# Patient Record
Sex: Female | Born: 1945 | Race: Black or African American | Hispanic: No | Smoking: Former smoker
Health system: Southern US, Community
[De-identification: ages and names within clinical notes are randomized; demographics above are authoritative.]

## PROBLEM LIST (undated history)

## (undated) DIAGNOSIS — E559 Vitamin D deficiency, unspecified: Secondary | ICD-10-CM

## (undated) DIAGNOSIS — R569 Unspecified convulsions: Secondary | ICD-10-CM

## (undated) DIAGNOSIS — F200 Paranoid schizophrenia: Secondary | ICD-10-CM

## (undated) HISTORY — DX: Vitamin D deficiency, unspecified: E55.9

## (undated) HISTORY — DX: Unspecified convulsions: R56.9

## (undated) HISTORY — DX: Paranoid schizophrenia: F20.0

## (undated) HISTORY — PX: HIP SURGERY: SHX245

---

## 2015-08-26 ENCOUNTER — Other Ambulatory Visit (HOSPITAL_COMMUNITY): Payer: Self-pay | Admitting: Internal Medicine

## 2015-08-26 DIAGNOSIS — Z1231 Encounter for screening mammogram for malignant neoplasm of breast: Secondary | ICD-10-CM

## 2015-09-04 ENCOUNTER — Inpatient Hospital Stay (HOSPITAL_COMMUNITY): Admission: RE | Admit: 2015-09-04 | Payer: Self-pay | Source: Ambulatory Visit

## 2015-10-17 ENCOUNTER — Ambulatory Visit (HOSPITAL_COMMUNITY): Payer: Self-pay

## 2015-11-04 ENCOUNTER — Ambulatory Visit (HOSPITAL_COMMUNITY): Payer: Self-pay

## 2015-11-06 ENCOUNTER — Ambulatory Visit (HOSPITAL_COMMUNITY)
Admission: RE | Admit: 2015-11-06 | Discharge: 2015-11-06 | Disposition: A | Discharge status: Home / Self Care | Payer: Medicaid Other | Source: Ambulatory Visit | Attending: Internal Medicine | Admitting: Internal Medicine

## 2015-11-06 DIAGNOSIS — Z1231 Encounter for screening mammogram for malignant neoplasm of breast: Secondary | ICD-10-CM | POA: Insufficient documentation

## 2016-08-31 ENCOUNTER — Telehealth: Payer: Self-pay

## 2016-08-31 NOTE — Telephone Encounter (Signed)
Pt received a triage letter from [facility]. Please call [phone]

## 2016-09-09 NOTE — Telephone Encounter (Signed)
I called and spoke to pt's sister, Cleon Gustin, who helps the pt. Pt does not understand, sister is not POA, but helps her out. Pt has been scheduled an OV with Lewie Loron, NP on 09/29/2016 at 1:30 pm.

## 2016-09-29 ENCOUNTER — Encounter: Payer: Self-pay | Admitting: Gastroenterology

## 2016-09-29 ENCOUNTER — Telehealth: Payer: Self-pay | Admitting: Gastroenterology

## 2016-09-29 ENCOUNTER — Ambulatory Visit: Payer: Medicaid Other | Admitting: Gastroenterology

## 2016-09-29 NOTE — Telephone Encounter (Signed)
PATIENT WAS A NO SHOW AND LETTER SENT  °

## 2016-10-01 ENCOUNTER — Other Ambulatory Visit: Payer: Self-pay

## 2016-10-01 ENCOUNTER — Encounter: Payer: Self-pay | Admitting: Nurse Practitioner

## 2016-10-01 ENCOUNTER — Ambulatory Visit (INDEPENDENT_AMBULATORY_CARE_PROVIDER_SITE_OTHER): Payer: Medicaid Other | Admitting: Nurse Practitioner

## 2016-10-01 DIAGNOSIS — Z1211 Encounter for screening for malignant neoplasm of colon: Secondary | ICD-10-CM

## 2016-10-01 DIAGNOSIS — R69 Illness, unspecified: Secondary | ICD-10-CM | POA: Insufficient documentation

## 2016-10-01 MED ORDER — PEG 3350-KCL-NA BICARB-NACL 420 G PO SOLR: 4000.0000 mL | ORAL | 0 refills | Status: AC

## 2016-10-01 NOTE — Assessment & Plan Note (Signed)
The patient is due for first-ever colonoscopy. She was referred by primary care. She is minimally communicative and accompanied by her sister today who is responsible for helping the patient with the majority of her care and medical care. She is essentially asymptomatic from a GI standpoint. She is on multiple antipsychotic medications. We will proceed with colonoscopy at this time.  Proceed with colonoscopy on propofol/MAC with Dr. Oneida Alar in the near future. The risks, benefits, and alternatives have been discussed in detail with the patient. They state understanding and desire to proceed.   The patient is on antipsychotics and has a history of paranoid schizophrenia. No other anticoagulants, anxiolytics, chronic pain medications, or antidepressants. We will and for the procedure on propofol/MAC to promote adequate sedation.  The patient's Sr. Nevada Crane sign her consent for the procedure.

## 2016-10-01 NOTE — Patient Instructions (Signed)
1. We will schedule your colonoscopy for you. 2. Further recommendations will be made after your procedure. 3. Return for follow-up based on recommendations made after your colonoscopy. 4. Call with any questions or concerns.

## 2016-10-01 NOTE — Progress Notes (Signed)
cc'ed to pcp °
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
## 2016-10-16 NOTE — Patient Instructions (Signed)
Bonnie Norton  10/16/2016     @PREFPERIOPPHARMACY @   Your procedure is scheduled on   10/27/2016   Report to [facility] at  1230  P.M.  Call this number if you have problems the morning of surgery:  [phone]   Remember:  Do not eat food or drink liquids after midnight.  Take these medicines the morning of surgery with A SIP OF WATER  depakote   Do not wear jewelry, make-up or nail polish.  Do not wear lotions, powders, or perfumes, or deoderant.  Do not shave 48 hours prior to surgery.  Men may shave face and neck.  Do not bring valuables to the hospital.  [facility] is not responsible for any belongings or valuables.  Contacts, dentures or bridgework may not be worn into surgery.  Leave your suitcase in the car.  After surgery it may be brought to your room.  For patients admitted to the hospital, discharge time will be determined by your treatment team.  Patients discharged the day of surgery will not be allowed to drive home.   Name and phone number of your driver:   family Special instructions:  Follow the diet and prep inatructions given to you by Dr Evelina Dun office.  Please read over the following fact sheets that you were given. Anesthesia Post-op Instructions and Care and Recovery After Surgery       Colonoscopy, Adult A colonoscopy is an exam to look at the entire large intestine. During the exam, a lubricated, bendable tube is inserted into the anus and then passed into the rectum, colon, and other parts of the large intestine. A colonoscopy is often done as a part of normal colorectal screening or in response to certain symptoms, such as anemia, persistent diarrhea, abdominal pain, and blood in the stool. The exam can help screen for and diagnose medical problems, including:  Tumors.  Polyps.  Inflammation.  Areas of bleeding. Tell a health care provider about:  Any allergies you have.  All medicines you are taking, including vitamins,  herbs, eye drops, creams, and over-the-counter medicines.  Any problems you or family members have had with anesthetic medicines.  Any blood disorders you have.  Any surgeries you have had.  Any medical conditions you have.  Any problems you have had passing stool. What are the risks? Generally, this is a safe procedure. However, problems may occur, including:  Bleeding.  A tear in the intestine.  A reaction to medicines given during the exam.  Infection (rare). What happens before the procedure? Eating and drinking restrictions  Follow instructions from your health care provider about eating and drinking, which may include:  A few days before the procedure - follow a low-fiber diet. Avoid nuts, seeds, dried fruit, raw fruits, and vegetables.  1-3 days before the procedure - follow a clear liquid diet. Drink only clear liquids, such as clear broth or bouillon, black coffee or tea, clear juice, clear soft drinks or sports drinks, gelatin dessert, and popsicles. Avoid any liquids that contain red or purple dye.  On the day of the procedure - do not eat or drink anything during the 2 hours before the procedure, or within the time period that your health care provider recommends. Bowel prep  If you were prescribed an oral bowel prep to clean out your colon:  Take it as told by your health care provider. Starting the day before your procedure, you will need  to drink a large amount of medicated liquid. The liquid will cause you to have multiple loose stools until your stool is almost clear or light green.  If your skin or anus gets irritated from diarrhea, you may use these to relieve the irritation:  Medicated wipes, such as adult wet wipes with aloe and vitamin E.  A skin soothing-product like petroleum jelly.  If you vomit while drinking the bowel prep, take a break for up to 60 minutes and then begin the bowel prep again. If vomiting continues and you cannot take the bowel prep  without vomiting, call your health care provider. General instructions   Ask your health care provider about changing or stopping your regular medicines. This is especially important if you are taking diabetes medicines or blood thinners.  Plan to have someone take you home from the hospital or clinic. What happens during the procedure?  An IV tube may be inserted into one of your veins.  You will be given medicine to help you relax (sedative).  To reduce your risk of infection:  Your health care team will wash or sanitize their hands.  Your anal area will be washed with soap.  You will be asked to lie on your side with your knees bent.  Your health care provider will lubricate a long, thin, flexible tube. The tube will have a camera and a light on the end.  The tube will be inserted into your anus.  The tube will be gently eased through your rectum and colon.  Air will be delivered into your colon to keep it open. You may feel some pressure or cramping.  The camera will be used to take images during the procedure.  A small tissue sample may be removed from your body to be examined under a microscope (biopsy). If any potential problems are found, the tissue will be sent to a lab for testing.  If small polyps are found, your health care provider may remove them and have them checked for cancer cells.  The tube that was inserted into your anus will be slowly removed. The procedure may vary among health care providers and hospitals. What happens after the procedure?  Your blood pressure, heart rate, breathing rate, and blood oxygen level will be monitored until the medicines you were given have worn off.  Do not drive for 24 hours after the exam.  You may have a small amount of blood in your stool.  You may pass gas and have mild abdominal cramping or bloating due to the air that was used to inflate your colon during the exam.  It is up to you to get the results of your  procedure. Ask your health care provider, or the department performing the procedure, when your results will be ready. This information is not intended to replace advice given to you by your health care provider. Make sure you discuss any questions you have with your health care provider. Document Released: 05/08/2000 Document Revised: 03/11/2016 Document Reviewed: 07/23/2015 Elsevier Interactive Patient Education  2017 [employer]  Colonoscopy, Adult, Care After This sheet gives you information about how to care for yourself after your procedure. Your health care provider may also give you more specific instructions. If you have problems or questions, contact your health care provider. What can I expect after the procedure? After the procedure, it is common to have:  A small amount of blood in your stool for 24 hours after the procedure.  Some gas.  Mild abdominal cramping or bloating. Follow these instructions at home: General instructions    For the first 24 hours after the procedure:  Do not drive or use machinery.  Do not sign important documents.  Do not drink alcohol.  Do your regular daily activities at a slower pace than normal.  Eat soft, easy-to-digest foods.  Rest often.  Take over-the-counter or prescription medicines only as told by your health care provider.  It is up to you to get the results of your procedure. Ask your health care provider, or the department performing the procedure, when your results will be ready. Relieving cramping and bloating   Try walking around when you have cramps or feel bloated.  Apply heat to your abdomen as told by your health care provider. Use a heat source that your health care provider recommends, such as a moist heat pack or a heating pad.  Place a towel between your skin and the heat source.  Leave the heat on for 20-30 minutes.  Remove the heat if your skin turns bright red. This is especially important if you are  unable to feel pain, heat, or cold. You may have a greater risk of getting burned. Eating and drinking   Drink enough fluid to keep your urine clear or pale yellow.  Resume your normal diet as instructed by your health care provider. Avoid heavy or fried foods that are hard to digest.  Avoid drinking alcohol for as long as instructed by your health care provider. Contact a health care provider if:  You have blood in your stool 2-3 days after the procedure. Get help right away if:  You have more than a small spotting of blood in your stool.  You pass large blood clots in your stool.  Your abdomen is swollen.  You have nausea or vomiting.  You have a fever.  You have increasing abdominal pain that is not relieved with medicine. This information is not intended to replace advice given to you by your health care provider. Make sure you discuss any questions you have with your health care provider. Document Released: 12/24/2003 Document Revised: 02/03/2016 Document Reviewed: 07/23/2015 Elsevier Interactive Patient Education  2017 Else[employer] Inc.  Monitored Anesthesia Care Anesthesia is a term that refers to techniques, procedures, and medicines that help a person stay safe and comfortable during a medical procedure. Monitored anesthesia care, or sedation, is one type of anesthesia. Your anesthesia specialist may recommend sedation if you will be having a procedure that does not require you to be unconscious, such as:  Cataract surgery.  A dental procedure.  A biopsy.  A colonoscopy. During the procedure, you may receive a medicine to help you relax (sedative). There are three levels of sedation:  Mild sedation. At this level, you may feel awake and relaxed. You will be able to follow directions.  Moderate sedation. At this level, you will be sleepy. You may not remember the procedure.  Deep sedation. At this level, you will be asleep. You will not remember the procedure. The  more medicine you are given, the deeper your level of sedation will be. Depending on how you respond to the procedure, the anesthesia specialist may change your level of sedation or the type of anesthesia to fit your needs. An anesthesia specialist will monitor you closely during the procedure. Let your health care provider know about:  Any allergies you have.  All medicines you are taking, including vitamins, herbs, eye drops, creams, and over-the-counter medicines.  Any use of steroids (by mouth or as a cream).  Any problems you or family members have had with sedatives and anesthetic medicines.  Any blood disorders you have.  Any surgeries you have had.  Any medical conditions you have, such as sleep apnea.  Whether you are pregnant or may be pregnant.  Any use of cigarettes, alcohol, or street drugs. What are the risks? Generally, this is a safe procedure. However, problems may occur, including:  Getting too much medicine (oversedation).  Nausea.  Allergic reaction to medicines.  Trouble breathing. If this happens, a breathing tube may be used to help with breathing. It will be removed when you are awake and breathing on your own.  Heart trouble.  Lung trouble. Before the procedure Staying hydrated  Follow instructions from your health care provider about hydration, which may include:  Up to 2 hours before the procedure - you may continue to drink clear liquids, such as water, clear fruit juice, black coffee, and plain tea. Eating and drinking restrictions  Follow instructions from your health care provider about eating and drinking, which may include:  8 hours before the procedure - stop eating heavy meals or foods such as meat, fried foods, or fatty foods.  6 hours before the procedure - stop eating light meals or foods, such as toast or cereal.  6 hours before the procedure - stop drinking milk or drinks that contain milk.  2 hours before the procedure - stop  drinking clear liquids. Medicines  Ask your health care provider about:  Changing or stopping your regular medicines. This is especially important if you are taking diabetes medicines or blood thinners.  Taking medicines such as aspirin and ibuprofen. These medicines can thin your blood. Do not take these medicines before your procedure if your health care provider instructs you not to. Tests and exams  You will have a physical exam.  You may have blood tests done to show:  How well your kidneys and liver are working.  How well your blood can clot.  General instructions  Plan to have someone take you home from the hospital or clinic.  If you will be going home right after the procedure, plan to have someone with you for 24 hours. What happens during the procedure?  Your blood pressure, heart rate, breathing, level of pain and overall condition will be monitored.  An IV tube will be inserted into one of your veins.  Your anesthesia specialist will give you medicines as needed to keep you comfortable during the procedure. This may mean changing the level of sedation.  The procedure will be performed. After the procedure  Your blood pressure, heart rate, breathing rate, and blood oxygen level will be monitored until the medicines you were given have worn off.  Do not drive for 24 hours if you received a sedative.  You may:  Feel sleepy, clumsy, or nauseous.  Feel forgetful about what happened after the procedure.  Have a sore throat if you had a breathing tube during the procedure.  Vomit. This information is not intended to replace advice given to you by your health care provider. Make sure you discuss any questions you have with your health care provider. Document Released: 02/04/2005 Document Revised: 10/18/2015 Document Reviewed: 09/01/2015 Elsevier Interactive Patient Education  2017 [employer] Monitored Anesthesia Care, Care After These instructions provide you  with information about caring for yourself after your procedure. Your health care provider may also give you more specific instructions.  Your treatment has been planned according to current medical practices, but problems sometimes occur. Call your health care provider if you have any problems or questions after your procedure. What can I expect after the procedure? After your procedure, it is common to:  Feel sleepy for several hours.  Feel clumsy and have poor balance for several hours.  Feel forgetful about what happened after the procedure.  Have poor judgment for several hours.  Feel nauseous or vomit.  Have a sore throat if you had a breathing tube during the procedure. Follow these instructions at home: For at least 24 hours after the procedure:    Do not:  Participate in activities in which you could fall or become injured.  Drive.  Use heavy machinery.  Drink alcohol.  Take sleeping pills or medicines that cause drowsiness.  Make important decisions or sign legal documents.  Take care of children on your own.  Rest. Eating and drinking   Follow the diet that is recommended by your health care provider.  If you vomit, drink water, juice, or soup when you can drink without vomiting.  Make sure you have little or no nausea before eating solid foods. General instructions   Have a responsible adult stay with you until you are awake and alert.  Take over-the-counter and prescription medicines only as told by your health care provider.  If you smoke, do not smoke without supervision.  Keep all follow-up visits as told by your health care provider. This is important. Contact a health care provider if:  You keep feeling nauseous or you keep vomiting.  You feel light-headed.  You develop a rash.  You have a fever. Get help right away if:  You have trouble breathing. This information is not intended to replace advice given to you by your health care  provider. Make sure you discuss any questions you have with your health care provider. Document Released: 09/01/2015 Document Revised: 01/01/2016 Document Reviewed: 09/01/2015 Elsevier Interactive Patient Education  2017 [employer].

## 2016-10-21 ENCOUNTER — Encounter (HOSPITAL_COMMUNITY): Payer: Self-pay

## 2016-10-21 ENCOUNTER — Encounter (HOSPITAL_COMMUNITY)
Admission: RE | Admit: 2016-10-21 | Discharge: 2016-10-21 | Disposition: A | Discharge status: Home / Self Care | Payer: Medicaid Other | Source: Ambulatory Visit | Attending: Gastroenterology | Admitting: Gastroenterology

## 2016-10-21 NOTE — Pre-Procedure Instructions (Signed)
Patient did not show for PAT. Called patient x2 and no answer. There was no answering machine to leave message. Called Ginger at Dr Evelina DunField's office to make her aware.

## 2016-10-26 ENCOUNTER — Telehealth: Payer: Self-pay

## 2016-10-26 NOTE — Telephone Encounter (Signed)
Pt is set up for office visit on 12/03/16 @ 1:30

## 2016-10-26 NOTE — Telephone Encounter (Signed)
THE patient MAY CONTACT US TO RESCHEDULE. HOWEVER IF SHE CALLS TO [facility] AFTER 30 DAYS SHE WILL NEED AN OPV PRIOR TO TCS.

## 2016-10-26 NOTE — Telephone Encounter (Signed)
Pt's sister called ( Mel Clark) to cancel her TCS. I told her that she might have to come back into the office to get rescheduled. Please advise  If she needs to come back into the office.

## 2016-10-27 ENCOUNTER — Encounter (HOSPITAL_COMMUNITY): Admission: RE | Payer: Self-pay | Source: Ambulatory Visit

## 2016-10-27 ENCOUNTER — Ambulatory Visit (HOSPITAL_COMMUNITY): Admission: RE | Admit: 2016-10-27 | Payer: Medicaid Other | Source: Ambulatory Visit | Admitting: Gastroenterology

## 2016-10-27 SURGERY — COLONOSCOPY WITH PROPOFOL
Anesthesia: Monitor Anesthesia Care

## 2016-11-19 ENCOUNTER — Other Ambulatory Visit (HOSPITAL_COMMUNITY): Payer: Self-pay | Admitting: Internal Medicine

## 2016-11-19 DIAGNOSIS — Z1231 Encounter for screening mammogram for malignant neoplasm of breast: Secondary | ICD-10-CM

## 2016-11-23 ENCOUNTER — Ambulatory Visit (HOSPITAL_COMMUNITY): Payer: Medicaid Other

## 2016-11-26 ENCOUNTER — Ambulatory Visit (HOSPITAL_COMMUNITY)
Admission: RE | Admit: 2016-11-26 | Discharge: 2016-11-26 | Disposition: A | Discharge status: Home / Self Care | Payer: Medicaid Other | Source: Ambulatory Visit | Attending: Internal Medicine | Admitting: Internal Medicine

## 2016-11-26 DIAGNOSIS — Z1231 Encounter for screening mammogram for malignant neoplasm of breast: Secondary | ICD-10-CM | POA: Diagnosis present

## 2016-12-03 ENCOUNTER — Ambulatory Visit: Payer: Medicaid Other | Admitting: Gastroenterology

## 2017-01-08 NOTE — Progress Notes (Signed)
REVIEWED. NO SHOW JUL 2018.

## 2018-02-17 ENCOUNTER — Other Ambulatory Visit (HOSPITAL_COMMUNITY): Payer: Self-pay | Admitting: Internal Medicine

## 2018-02-17 DIAGNOSIS — Z1231 Encounter for screening mammogram for malignant neoplasm of breast: Secondary | ICD-10-CM

## 2018-02-24 ENCOUNTER — Ambulatory Visit (HOSPITAL_COMMUNITY)
Admission: RE | Admit: 2018-02-24 | Discharge: 2018-02-24 | Disposition: A | Discharge status: Home / Self Care | Payer: Medicaid Other | Source: Ambulatory Visit | Attending: Internal Medicine | Admitting: Internal Medicine

## 2018-02-24 ENCOUNTER — Encounter (HOSPITAL_COMMUNITY): Payer: Self-pay

## 2018-02-24 DIAGNOSIS — Z1231 Encounter for screening mammogram for malignant neoplasm of breast: Secondary | ICD-10-CM | POA: Diagnosis present

## 2019-05-09 ENCOUNTER — Other Ambulatory Visit (HOSPITAL_COMMUNITY): Payer: Self-pay | Admitting: Internal Medicine

## 2019-05-09 DIAGNOSIS — Z1231 Encounter for screening mammogram for malignant neoplasm of breast: Secondary | ICD-10-CM

## 2019-06-01 ENCOUNTER — Ambulatory Visit (HOSPITAL_COMMUNITY): Payer: Medicaid Other

## 2019-06-15 ENCOUNTER — Other Ambulatory Visit: Payer: Self-pay

## 2019-06-15 ENCOUNTER — Ambulatory Visit (HOSPITAL_COMMUNITY)
Admission: RE | Admit: 2019-06-15 | Discharge: 2019-06-15 | Disposition: A | Discharge status: Home / Self Care | Payer: Medicaid Other | Source: Ambulatory Visit | Attending: Internal Medicine | Admitting: Internal Medicine

## 2019-06-15 DIAGNOSIS — Z1231 Encounter for screening mammogram for malignant neoplasm of breast: Secondary | ICD-10-CM | POA: Diagnosis not present

## 2020-04-01 ENCOUNTER — Ambulatory Visit (HOSPITAL_COMMUNITY)
Admission: RE | Admit: 2020-04-01 | Discharge: 2020-04-01 | Disposition: A | Discharge status: Home / Self Care | Payer: Medicaid Other | Source: Ambulatory Visit | Attending: Gerontology | Admitting: Gerontology

## 2020-04-01 ENCOUNTER — Other Ambulatory Visit: Payer: Self-pay

## 2020-04-01 ENCOUNTER — Other Ambulatory Visit (HOSPITAL_COMMUNITY): Payer: Self-pay

## 2020-04-01 ENCOUNTER — Other Ambulatory Visit (HOSPITAL_COMMUNITY): Payer: Self-pay | Admitting: Gerontology

## 2020-04-01 DIAGNOSIS — M25551 Pain in right hip: Secondary | ICD-10-CM | POA: Diagnosis present

## 2020-07-04 ENCOUNTER — Other Ambulatory Visit (HOSPITAL_COMMUNITY): Payer: Self-pay | Admitting: Internal Medicine

## 2020-07-04 DIAGNOSIS — Z1231 Encounter for screening mammogram for malignant neoplasm of breast: Secondary | ICD-10-CM

## 2020-08-01 ENCOUNTER — Ambulatory Visit (HOSPITAL_COMMUNITY)
Admission: RE | Admit: 2020-08-01 | Discharge: 2020-08-01 | Disposition: A | Discharge status: Home / Self Care | Payer: Medicaid Other | Source: Ambulatory Visit | Attending: Internal Medicine | Admitting: Internal Medicine

## 2020-08-01 ENCOUNTER — Other Ambulatory Visit: Payer: Self-pay

## 2020-08-01 DIAGNOSIS — Z1231 Encounter for screening mammogram for malignant neoplasm of breast: Secondary | ICD-10-CM | POA: Insufficient documentation

## 2021-09-08 ENCOUNTER — Other Ambulatory Visit (HOSPITAL_COMMUNITY): Payer: Self-pay | Admitting: Hospitalist

## 2021-09-08 DIAGNOSIS — Z1231 Encounter for screening mammogram for malignant neoplasm of breast: Secondary | ICD-10-CM

## 2021-10-02 ENCOUNTER — Ambulatory Visit (HOSPITAL_COMMUNITY)
Admission: RE | Admit: 2021-10-02 | Discharge: 2021-10-02 | Disposition: A | Discharge status: Home / Self Care | Payer: Medicaid Other | Source: Ambulatory Visit | Attending: Hospitalist | Admitting: Hospitalist

## 2021-10-02 DIAGNOSIS — Z1231 Encounter for screening mammogram for malignant neoplasm of breast: Secondary | ICD-10-CM | POA: Diagnosis present

## 2022-12-18 IMAGING — MG MM DIGITAL SCREENING BILAT W/ TOMO AND CAD
8 series · 8 of 24 positions shown · non-contrast
Comparison: Previous exam(s).

CLINICAL DATA: Screening.

EXAM:
DIGITAL SCREENING BILATERAL MAMMOGRAM WITH TOMOSYNTHESIS AND CAD
TECHNIQUE: Bilateral screening digital craniocaudal and mediolateral oblique
mammograms were obtained. Bilateral screening digital breast
tomosynthesis was performed. The images were evaluated with
computer-aided detection.

[R CC synth-2D]
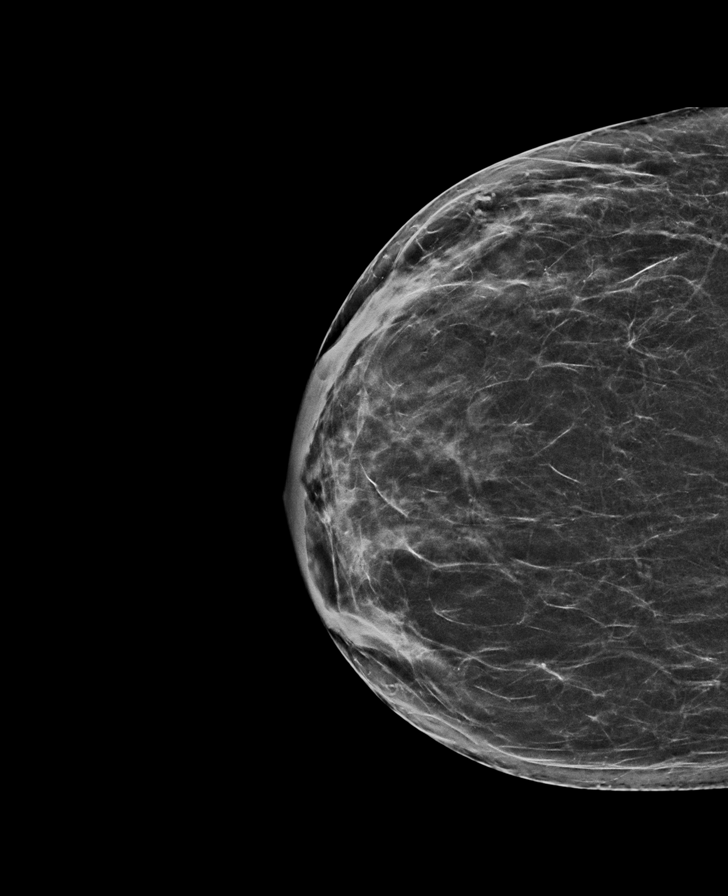

[R MLO synth-2D]
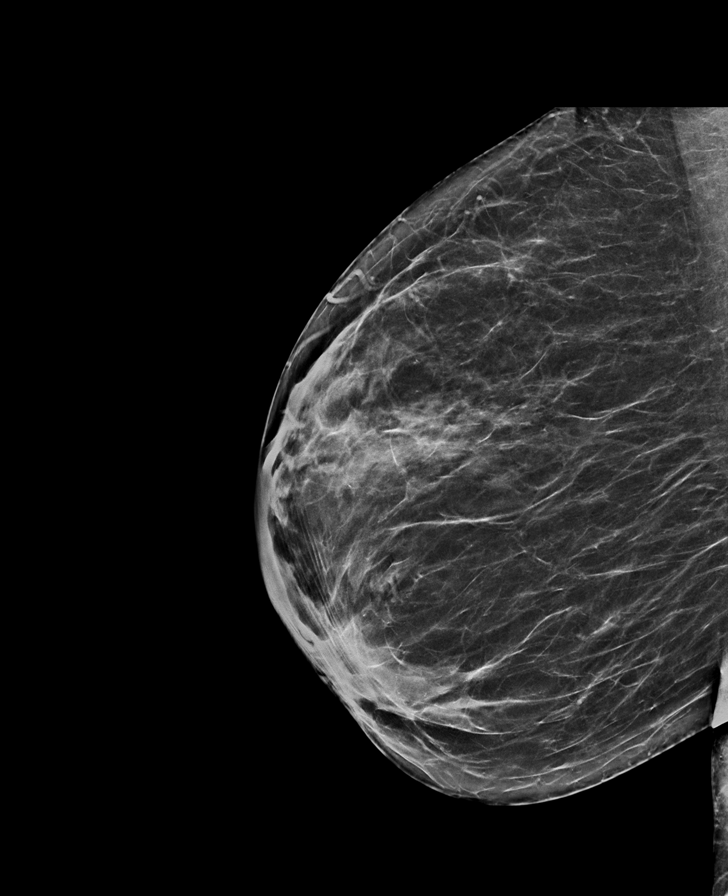

[L CC synth-2D]
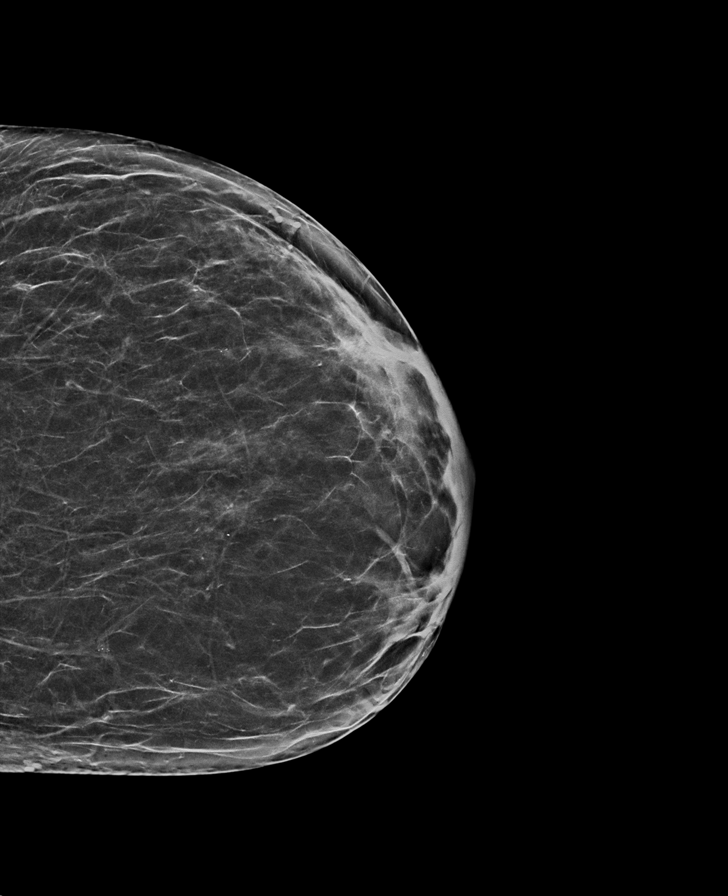

[L MLO synth-2D]
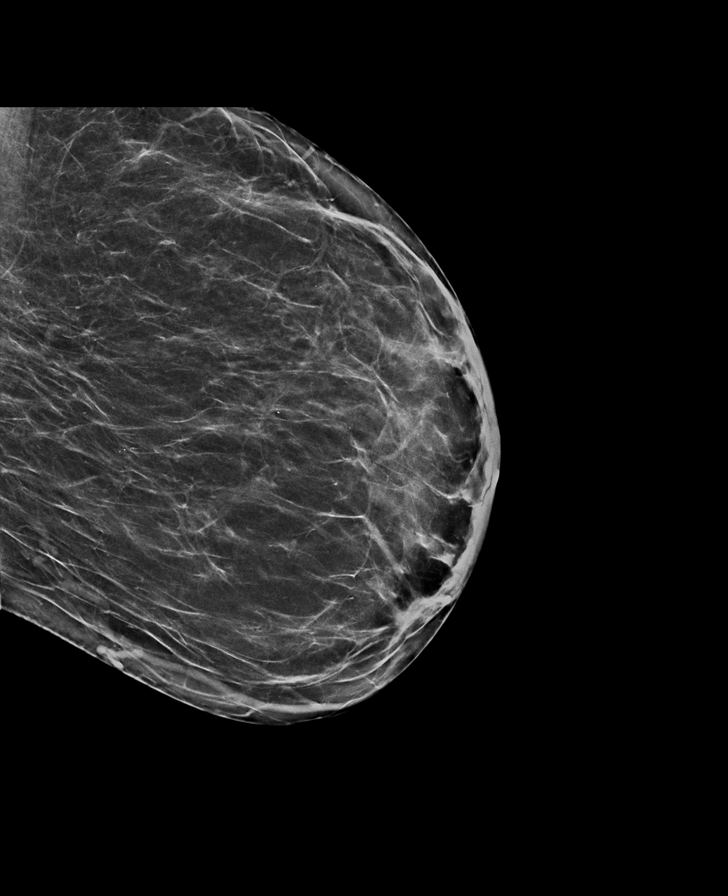

[L MLO tomo · tomo slice 35/69.0]
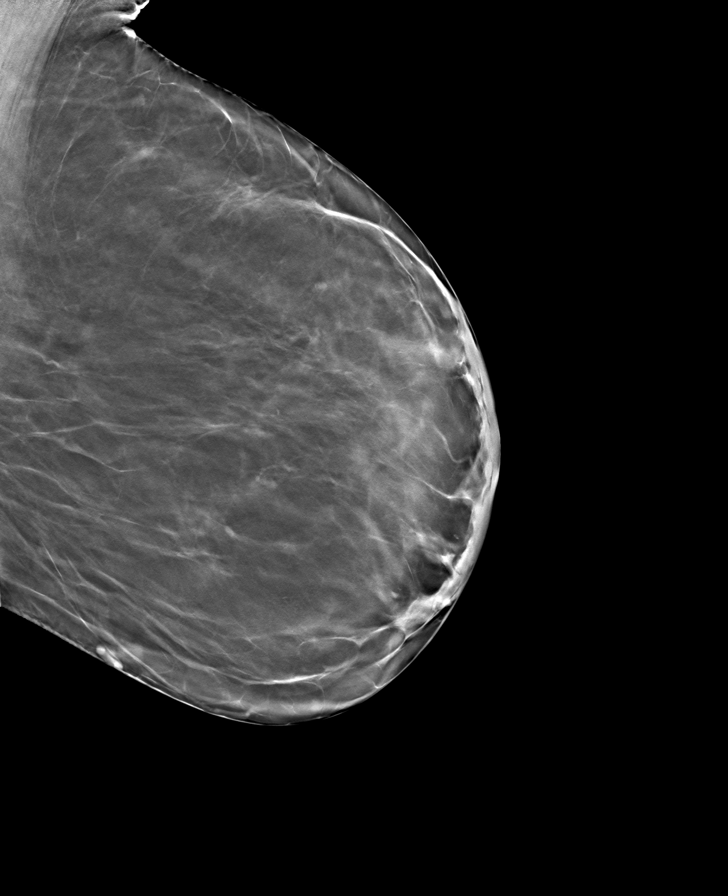

[R MLO tomo · tomo slice 35/70.0]
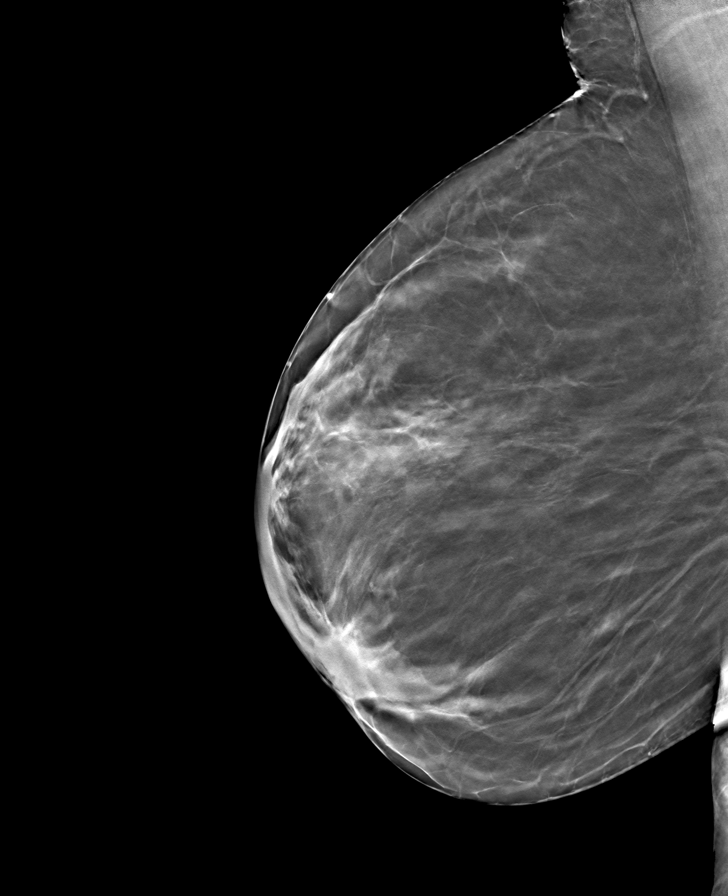

[R CC tomo · tomo slice 33/66.0]
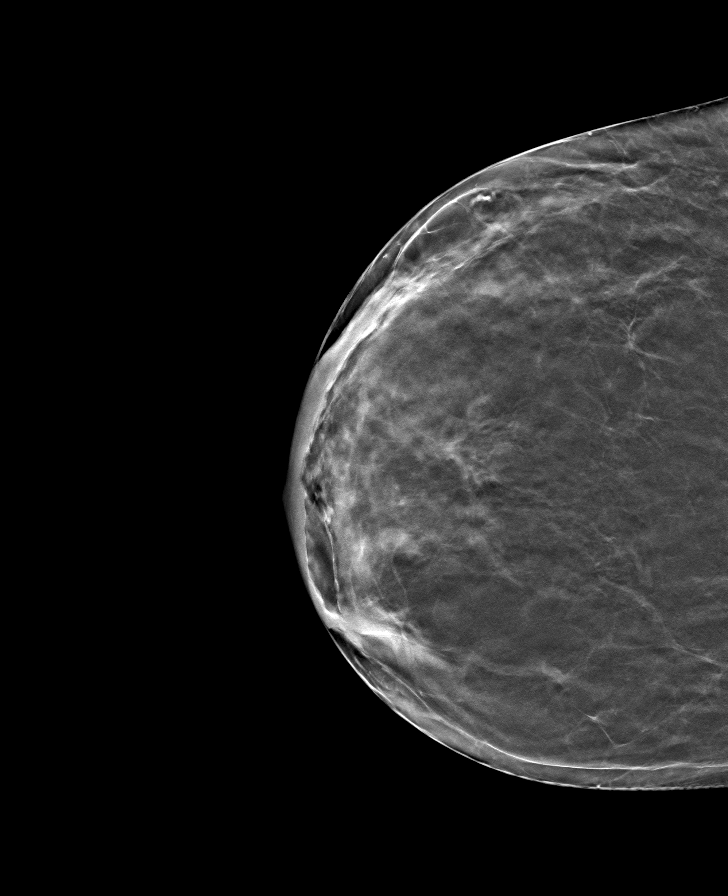

[L CC tomo · tomo slice 33/64.0]
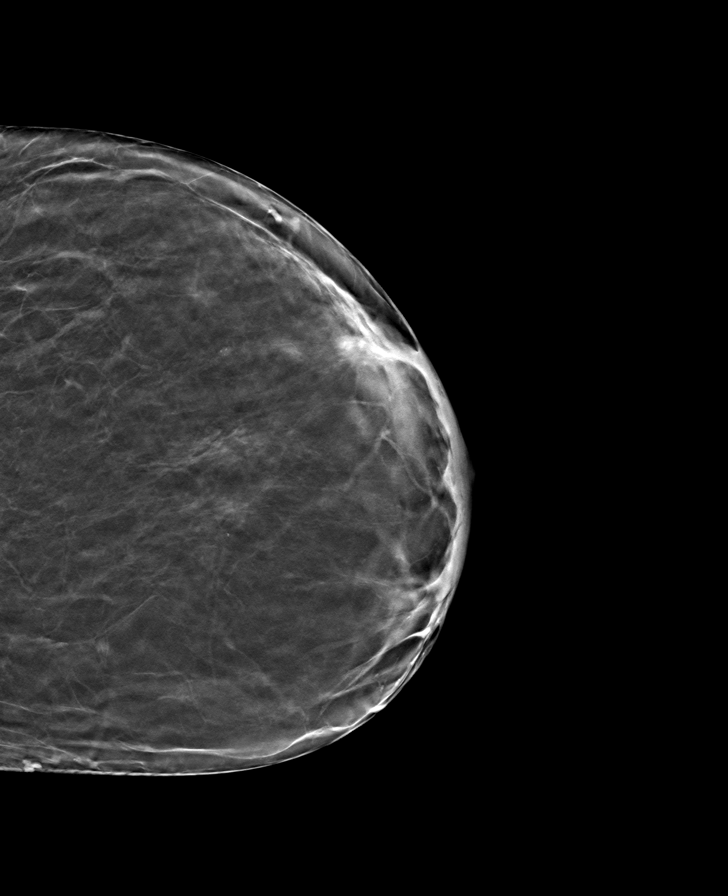

[8 of 24 positions shown; findings below may reference images not displayed]

ACR Breast Density Category b: There are scattered areas of
fibroglandular density.
FINDINGS: There are no findings suspicious for malignancy.
IMPRESSION: No mammographic evidence of malignancy. A result letter of this
screening mammogram will be mailed directly to the patient.

RECOMMENDATION:
Screening mammogram in one year. (Code:51-O-LD2)

BI-RADS CATEGORY  1: Negative.

## 2024-06-25 ENCOUNTER — Emergency Department (HOSPITAL_COMMUNITY)
Admission: EM | Admit: 2024-06-25 | Discharge: 2024-06-25 | Disposition: A | Discharge status: Home / Self Care | Attending: Emergency Medicine | Admitting: Emergency Medicine

## 2024-06-25 ENCOUNTER — Encounter (HOSPITAL_COMMUNITY): Payer: Self-pay | Admitting: *Deleted

## 2024-06-25 ENCOUNTER — Other Ambulatory Visit: Payer: Self-pay

## 2024-06-25 DIAGNOSIS — N390 Urinary tract infection, site not specified: Secondary | ICD-10-CM | POA: Diagnosis not present

## 2024-06-25 DIAGNOSIS — F0390 Unspecified dementia without behavioral disturbance: Secondary | ICD-10-CM | POA: Diagnosis not present

## 2024-06-25 DIAGNOSIS — R300 Dysuria: Secondary | ICD-10-CM | POA: Diagnosis present

## 2024-06-25 DIAGNOSIS — N289 Disorder of kidney and ureter, unspecified: Secondary | ICD-10-CM | POA: Diagnosis not present

## 2024-06-25 LAB — URINALYSIS, ROUTINE W REFLEX MICROSCOPIC
Bilirubin Urine: NEGATIVE
Glucose, UA: NEGATIVE mg/dL
Ketones, ur: NEGATIVE mg/dL
Nitrite: NEGATIVE
Protein, ur: 100 mg/dL — AB
Specific Gravity, Urine: 1.011 (ref 1.005–1.030)
WBC, UA: >50 WBC/hpf (ref 0–5)
pH: 8 (ref 5.0–8.0)

## 2024-06-25 LAB — CBC WITH DIFFERENTIAL/PLATELET
Abs Immature Granulocytes: 0.1 10*3/uL — ABNORMAL HIGH (ref 0.00–0.07)
Basophils Absolute: 0 10*3/uL (ref 0.0–0.1)
Basophils Relative: 0 %
Eosinophils Absolute: 0.1 10*3/uL (ref 0.0–0.5)
Eosinophils Relative: 1 %
HCT: 33.5 % — ABNORMAL LOW (ref 36.0–46.0)
Hemoglobin: 10.7 g/dL — ABNORMAL LOW (ref 12.0–15.0)
Immature Granulocytes: 2 %
Lymphocytes Relative: 6 %
Lymphs Abs: 0.4 10*3/uL — ABNORMAL LOW (ref 0.7–4.0)
MCH: 26.2 pg (ref 26.0–34.0)
MCHC: 31.9 g/dL (ref 30.0–36.0)
MCV: 81.9 fL (ref 80.0–100.0)
Monocytes Absolute: 0.1 10*3/uL (ref 0.1–1.0)
Monocytes Relative: 2 %
Neutro Abs: 6.2 10*3/uL (ref 1.7–7.7)
Neutrophils Relative %: 89 %
Platelets: 172 10*3/uL (ref 150–400)
RBC: 4.09 MIL/uL (ref 3.87–5.11)
RDW: 15.3 % (ref 11.5–15.5)
WBC: 6.9 10*3/uL (ref 4.0–10.5)
nRBC: 0 % (ref 0.0–0.2)

## 2024-06-25 LAB — BASIC METABOLIC PANEL WITH GFR
Anion gap: 17 — ABNORMAL HIGH (ref 5–15)
BUN: 29 mg/dL — ABNORMAL HIGH (ref 8–23)
CO2: 17 mmol/L — ABNORMAL LOW (ref 22–32)
Calcium: 9.3 mg/dL (ref 8.9–10.3)
Chloride: 108 mmol/L (ref 98–111)
Creatinine, Ser: 1.68 mg/dL — ABNORMAL HIGH (ref 0.44–1.00)
GFR, Estimated: 31 mL/min — ABNORMAL LOW
Glucose, Bld: 155 mg/dL — ABNORMAL HIGH (ref 70–99)
Potassium: 3.8 mmol/L (ref 3.5–5.1)
Sodium: 141 mmol/L (ref 135–145)

## 2024-06-25 MED ORDER — SODIUM CHLORIDE 0.9 % IV BOLUS
1000.0000 mL | Freq: Once | INTRAVENOUS | Status: AC
  Administered 2024-06-25: 1000 mL via INTRAVENOUS

## 2024-06-25 MED ORDER — CEPHALEXIN 500 MG PO CAPS: 500.0000 mg | ORAL_CAPSULE | Freq: Two times a day (BID) | ORAL | 0 refills | Status: AC

## 2024-06-25 MED ORDER — SODIUM CHLORIDE 0.9 % IV SOLN
2.0000 g | Freq: Once | INTRAVENOUS | Status: AC
  Administered 2024-06-25: 2 g via INTRAVENOUS
  Filled 2024-06-25: qty 20

## 2024-06-25 NOTE — Discharge Instructions (Addendum)
 You were seen in the emergency department for possible urinary tract infection.  Your urine showed signs of infection and we were given IV antibiotics and IV fluids.  You will need to follow-up with your primary care doctor for repeat lab work.  Please keep well-hydrated and finish antibiotics.  Return if any worsening or concerning symptoms

## 2024-06-25 NOTE — ED Provider Notes (Signed)
 " [place] [facility] AT [facility] Provider Note   CSN: [id number] Arrival date & time: 06/25/24  2111     Patient presents with: Dysuria   Bonnie Norton is a 79 y.o. female.  She has a history of schizophrenia and is followed by [facility].  Family member bring her in for 1 week of urinary symptoms.  Strong odor.  No fever or vomiting.  Patient unable to give any history, level 5 caveat.  {Add pertinent medical, surgical, social history, OB history to YEP:67052} The history is provided by the patient and a relative.  Dysuria Pain quality:  Unable to specify Onset quality:  Gradual Duration:  1 week Timing:  Intermittent Progression:  Unchanged Chronicity:  Recurrent Recent urinary tract infections: no   Urinary symptoms: foul-smelling urine and frequent urination        Prior to Admission medications  Medication Sig Start Date End Date Taking? Authorizing Provider  Cholecalciferol (HM VITAMIN D3) 4000 units CAPS Take 4,000 Units by mouth daily.    [provider]  divalproex (DEPAKOTE ER) 250 MG 24 hr tablet Take 250 mg by mouth 2 (two) times daily.    [provider]  polyethylene glycol-electrolytes (TRILYTE) 420 g solution Take 4,000 mLs by mouth as directed. 10/01/16   Fields, Sandi L, MD  QUEtiapine (SEROQUEL) 50 MG tablet Take 100 mg by mouth at bedtime.    [provider]  sulfamethoxazole-trimethoprim (BACTRIM DS,SEPTRA DS) 800-160 MG tablet Take 1 tablet by mouth 2 (two) times daily.    [provider]  zonisamide (ZONEGRAN) 100 MG capsule Take 300 mg by mouth at bedtime.    [provider]    Allergies: Patient has no known allergies.    Review of Systems  Genitourinary:  Positive for dysuria.    Updated Vital Signs BP 95/60   Pulse 95   Temp 98.6 F (37 C) (Oral)   Resp 19   Ht 5' 3 (1.6 m)   Wt 68.9 kg   BMI 26.93 kg/m   Physical Exam Constitutional:      Appearance: Normal  appearance. She is well-developed.  HENT:     Head: Normocephalic and atraumatic.  Eyes:     Conjunctiva/sclera: Conjunctivae normal.  Cardiovascular:     Rate and Rhythm: Normal rate and regular rhythm.  Pulmonary:     Effort: Pulmonary effort is normal.     Breath sounds: Normal breath sounds.  Abdominal:     Tenderness: There is no abdominal tenderness. There is no guarding or rebound.  Musculoskeletal:        General: No deformity.     Cervical back: Neck supple.  Skin:    General: Skin is warm and dry.  Neurological:     General: No focal deficit present.     Mental Status: She is alert.     GCS: GCS eye subscore is 4. GCS verbal subscore is 5. GCS motor subscore is 6.     (all labs ordered are listed, but only abnormal results are displayed) Labs Reviewed  URINALYSIS, ROUTINE W REFLEX MICROSCOPIC - Abnormal; Notable for the following components:      Result Value   Color, Urine AMBER (*)    APPearance TURBID (*)    Hgb urine dipstick SMALL (*)    Protein, ur 100 (*)    Leukocytes,Ua MODERATE (*)    Bacteria, UA MANY (*)    All other components within normal limits  EKG: None  Radiology: No results found.  {Document cardiac monitor, telemetry assessment procedure when appropriate:32947} Procedures   Medications Ordered in the ED  sodium chloride  0.9 % bolus 1,000 mL (has no administration in time range)  cefTRIAXone  (ROCEPHIN ) 2 g in sodium chloride  0.9 % 100 mL IVPB (has no administration in time range)      {Click here for ABCD2, HEART and other calculators REFRESH Note before signing:1}                              Medical Decision Making Amount and/or Complexity of Data Reviewed Labs: ordered.   This patient complains of ***; this involves an extensive number of treatment Options and is a complaint that carries with it a high risk of complications and morbidity. The differential includes ***  I ordered, reviewed and interpreted labs, which  included *** I ordered medication *** and reviewed PMP when indicated. I ordered imaging studies which included *** and I independently    visualized and interpreted imaging which showed *** Additional history obtained from *** Previous records obtained and reviewed *** I consulted *** and discussed lab and imaging findings and discussed disposition.  Cardiac monitoring reviewed, *** Social determinants considered, *** Critical Interventions: ***  After the interventions stated above, I reevaluated the patient and found *** Admission and further testing considered, ***   {Document critical care time when appropriate  Document review of labs and clinical decision tools ie CHADS2VASC2, etc  Document your independent review of radiology images and any outside records  Document your discussion with family members, caretakers and with consultants  Document social determinants of health affecting pt's care  Document your decision making why or why not admission, treatments were needed:32947:::1}   Final diagnoses:  None    ED Discharge Orders     None        "

## 2024-06-25 NOTE — ED Triage Notes (Signed)
 Family member states pt has a bad odor to urine and concerned for UTI.  Reports pt has dementia.

## 2024-06-28 LAB — URINE CULTURE: Culture: >=100000 — AB

## 2024-06-29 ENCOUNTER — Telehealth (HOSPITAL_BASED_OUTPATIENT_CLINIC_OR_DEPARTMENT_OTHER): Payer: Self-pay | Admitting: *Deleted

## 2024-06-29 NOTE — Telephone Encounter (Signed)
 Post ED Visit - Positive Culture Follow-up  Culture report reviewed by antimicrobial stewardship pharmacist: [facility] [x]  Rankin Sams, Pharm.D. []  Venetia Gully, Pharm.D., BCPS AQ-ID []  Garrel Crews, Pharm.D., BCPS []  Almarie Lunger, Pharm.D., BCPS []  [place], [address].D., BCPS, AAHIVP []  Rosaline Bihari, Pharm.D., BCPS, AAHIVP []  Vernell Meier, PharmD, BCPS []  Latanya Hint, PharmD, BCPS []  Donald Medley, PharmD, BCPS []  Rocky Bold, PharmD []  Dorothyann Alert, PharmD, BCPS []  Morene Babe, PharmD  [facility] []  Rosaline Edison, PharmD []  Romona Bliss, PharmD []  Dolphus Roller, PharmD []  Veva Seip, Rph []  Vernell Daunt) Leonce, PharmD []  Eva Allis, PharmD []  Rosaline Millet, PharmD []  Iantha Batch, PharmD []  Arvin Gauss, PharmD []  Wanda Hasting, PharmD []  Ronal Rav, PharmD []  Rocky Slade, PharmD []  Bard Jeans, PharmD   Positive urine culture Treated with cephalexin , organism sensitive to the same and no further patient follow-up is required at this time.  Lorita Barnie Pereyra 06/29/2024, 12:51 PM
